# Patient Record
Sex: Male | Born: 1980 | Race: White | Hispanic: No | Marital: Single | Smoking: Never smoker
Health system: Southern US, Community
[De-identification: ages and names within clinical notes are randomized; demographics above are authoritative.]

## PROBLEM LIST (undated history)

## (undated) DIAGNOSIS — R011 Cardiac murmur, unspecified: Secondary | ICD-10-CM

## (undated) DIAGNOSIS — F419 Anxiety disorder, unspecified: Secondary | ICD-10-CM

## (undated) DIAGNOSIS — N200 Calculus of kidney: Secondary | ICD-10-CM

## (undated) HISTORY — PX: UMBILICAL HERNIA REPAIR: SHX196

## (undated) HISTORY — DX: Anxiety disorder, unspecified: F41.9

---

## 2000-08-10 ENCOUNTER — Encounter: Payer: Self-pay | Admitting: Emergency Medicine

## 2000-08-10 ENCOUNTER — Emergency Department (HOSPITAL_COMMUNITY): Admission: EM | Admit: 2000-08-10 | Discharge: 2000-08-10 | Payer: Self-pay | Admitting: Emergency Medicine

## 2003-10-22 ENCOUNTER — Ambulatory Visit (HOSPITAL_COMMUNITY): Admission: RE | Admit: 2003-10-22 | Discharge: 2003-10-22 | Payer: Self-pay | Admitting: Family Medicine

## 2014-01-03 ENCOUNTER — Emergency Department (HOSPITAL_COMMUNITY)
Admission: EM | Admit: 2014-01-03 | Discharge: 2014-01-03 | Disposition: A | Discharge status: Home / Self Care | Payer: BC Managed Care – PPO | Attending: Emergency Medicine | Admitting: Emergency Medicine

## 2014-01-03 ENCOUNTER — Encounter (HOSPITAL_COMMUNITY): Payer: Self-pay | Admitting: Emergency Medicine

## 2014-01-03 DIAGNOSIS — N200 Calculus of kidney: Secondary | ICD-10-CM | POA: Insufficient documentation

## 2014-01-03 DIAGNOSIS — M549 Dorsalgia, unspecified: Secondary | ICD-10-CM | POA: Insufficient documentation

## 2014-01-03 DIAGNOSIS — R109 Unspecified abdominal pain: Secondary | ICD-10-CM

## 2014-01-03 DIAGNOSIS — R42 Dizziness and giddiness: Secondary | ICD-10-CM | POA: Diagnosis not present

## 2014-01-03 DIAGNOSIS — R319 Hematuria, unspecified: Secondary | ICD-10-CM

## 2014-01-03 LAB — URINALYSIS, ROUTINE W REFLEX MICROSCOPIC
Bilirubin Urine: NEGATIVE
Glucose, UA: NEGATIVE mg/dL
Ketones, ur: NEGATIVE mg/dL
Leukocytes, UA: NEGATIVE
Nitrite: NEGATIVE
Protein, ur: NEGATIVE mg/dL
Specific Gravity, Urine: 1.015 (ref 1.005–1.030)
Urobilinogen, UA: 0.2 mg/dL (ref 0.0–1.0)
pH: 8 (ref 5.0–8.0)

## 2014-01-03 LAB — URINE MICROSCOPIC-ADD ON

## 2014-01-03 MED ORDER — KETOROLAC TROMETHAMINE 60 MG/2ML IM SOLN: 60.0000 mg | Freq: Once | INTRAMUSCULAR | Status: DC

## 2014-01-03 MED ORDER — HYDROCODONE-ACETAMINOPHEN 5-325 MG PO TABS: 2.0000 | ORAL_TABLET | ORAL | Status: DC | PRN

## 2014-01-03 MED ORDER — SODIUM CHLORIDE 0.9 % IV BOLUS (SEPSIS)
1000.0000 mL | Freq: Once | INTRAVENOUS | Status: AC
  Administered 2014-01-03: 1000 mL via INTRAVENOUS

## 2014-01-03 MED ORDER — KETOROLAC TROMETHAMINE 30 MG/ML IJ SOLN
30.0000 mg | Freq: Once | INTRAMUSCULAR | Status: AC
  Administered 2014-01-03: 30 mg via INTRAVENOUS
  Filled 2014-01-03: qty 1

## 2014-01-03 MED ORDER — ONDANSETRON HCL 4 MG/2ML IJ SOLN
4.0000 mg | Freq: Once | INTRAMUSCULAR | Status: AC
  Administered 2014-01-03: 4 mg via INTRAVENOUS
  Filled 2014-01-03: qty 2

## 2014-01-03 MED ORDER — MORPHINE SULFATE 4 MG/ML IJ SOLN
4.0000 mg | INTRAMUSCULAR | Status: DC | PRN
  Administered 2014-01-03: 4 mg via INTRAVENOUS
  Filled 2014-01-03: qty 1

## 2014-01-03 NOTE — ED Notes (Addendum)
abd and low back pain for 30 min, pale,  Hyperventilating.   Nausea.

## 2014-01-03 NOTE — ED Notes (Signed)
Pt denies pain and nausea, provided urinal for pt.

## 2014-01-03 NOTE — ED Provider Notes (Signed)
CSN: [id number]     Arrival date & time 01/03/14  1239 History   First MD Initiated Contact with Patient 01/03/14 1257     Chief Complaint  Patient presents with  . Back Pain     (Consider location/radiation/quality/duration/timing/severity/associated sxs/prior Treatment) HPI Comments: 33 year old male with no significant medical history, no surgery history presents with acute left flank pain prior to arrival. Patient felt mild diaphoresis mild radiation to anterior abdomen. No history of similar. No family history of kidney stones. The last few days patient's had mild difficulty with urination. No fevers.  Patient is a 33 y.o. male presenting with back pain. The history is provided by the patient.  Back Pain Associated symptoms: no abdominal pain, no chest pain, no dysuria, no fever and no headaches     History reviewed. No pertinent past medical history. History reviewed. No pertinent past surgical history. History reviewed. No pertinent family history. History  Substance Use Topics  . Smoking status: Never Smoker   . Smokeless tobacco: Not on file  . Alcohol Use: No    Review of Systems  Constitutional: Positive for appetite change. Negative for fever and chills.  HENT: Negative for congestion.   Eyes: Negative for visual disturbance.  Respiratory: Negative for shortness of breath.   Cardiovascular: Negative for chest pain.  Gastrointestinal: Positive for nausea. Negative for abdominal pain.  Genitourinary: Positive for flank pain and difficulty urinating. Negative for dysuria.  Musculoskeletal: Positive for back pain. Negative for neck pain and neck stiffness.  Skin: Negative for rash.  Neurological: Positive for light-headedness. Negative for headaches.      Allergies  Review of patient's allergies indicates no known allergies.  Home Medications   Prior to Admission medications   Not on File   BP 131/95  Pulse 63  Temp(Src) 98.5 F (36.9 C) (Oral)  Resp 18   Ht  (1.651 m)  Wt 130 lb (58.968 kg)  BMI 21.63 kg/m2  SpO2 99% Physical Exam  Nursing note and vitals reviewed. Constitutional: He is oriented to person, place, and time. He appears well-developed and well-nourished.  HENT:  Head: Normocephalic and atraumatic.  Mild dry meters membranes  Eyes: Conjunctivae are normal. Right eye exhibits no discharge. Left eye exhibits no discharge.  Neck: Normal range of motion. Neck supple. No tracheal deviation present.  Cardiovascular: Normal rate.   Pulmonary/Chest: Effort normal.  Abdominal: Soft. He exhibits no distension. There is no tenderness. There is no guarding.  Musculoskeletal: He exhibits tenderness (mild left flank). He exhibits no edema.  Neurological: He is alert and oriented to person, place, and time.  Skin: Skin is warm. No rash noted.  Psychiatric: He has a normal mood and affect.    ED Course  Procedures (including critical care time) Emergency Focused Ultrasound Exam Limited retroperitoneal ultrasound of kidneys  Performed and interpreted by Dr. Jodi Mourning Indication: flank pain Focused abdominal ultrasound with both kidneys imaged in transverse and longitudinal planes in real-time. Interpretation: moderate left hydronephrosis visualized.   Images archived electronically  Labs Review Labs Reviewed  URINALYSIS, ROUTINE W REFLEX MICROSCOPIC - Abnormal; Notable for the following:    APPearance CLOUDY (*)    Hgb urine dipstick LARGE (*)    All other components within normal limits  URINE MICROSCOPIC-ADD ON - Abnormal; Notable for the following:    Bacteria, UA FEW (*)    All other components within normal limits    Imaging Review EKG Interpretation None      MDM  Final diagnoses:  Acute left flank pain  Kidney stone on left side  Hematuria   Clinically kidney stone, urinalysis pending to look for signs of infection. Plan for IV fluids, pain meds and outpatient followup with urology. Bedside ultrasound  mild-moderate hydro-ureter/hydronephrosis Patient well-appearing otherwise in ER.  Pain control reassessment, well-appearing no concerns. Results and differential diagnosis were discussed with the patient/parent/guardian. Close follow up outpatient was discussed, comfortable with the plan.   Medications  ondansetron (ZOFRAN) injection 4 mg (not administered)  ketorolac (TORADOL) 30 MG/ML injection 30 mg (not administered)  sodium chloride 0.9 % bolus 1,000 mL (not administered)  morphine 4 MG/ML injection 4 mg (not administered)    Filed Vitals:   01/03/14 1252  BP: 131/95  Pulse: 63  Temp: 98.5 F (36.9 C)  TempSrc: Oral  Resp: 18  Height:  (1.651 m)  Weight: 130 lb (58.968 kg)  SpO2: 99%    Final diagnoses:  Acute left flank pain  Kidney stone on left side  Hematuria        Enid Skeens, MD 01/05/14 [phone]

## 2014-01-03 NOTE — Discharge Instructions (Signed)
If you were given medicines take as directed.  If you are on coumadin or contraceptives realize their levels and effectiveness is altered by many different medicines.  If you have any reaction (rash, tongues swelling, other) to the medicines stop taking and see a physician.   Strain urine.  Take ibuprofen 600 mg every 6 hrs for pain. Please follow up as directed and return to the ER or see a physician for new or worsening symptoms.  Thank you. Filed Vitals:   01/03/14 1252  BP: 131/95  Pulse: 63  Temp: 98.5 F (36.9 C)  TempSrc: Oral  Resp: 18  Height:  (1.651 m)  Weight: 130 lb (58.968 kg)  SpO2: 99%   For severe pain take norco or vicodin however realize they have the potential for addiction and it can make you sleepy and has tylenol in it.  No operating machinery while taking.

## 2014-01-03 NOTE — ED Notes (Signed)
Pt alert & oriented x4, stable gait. Patient given discharge instructions, paperwork & prescription(s). Patient  instructed to stop at the registration desk to finish any additional paperwork. Patient verbalized understanding. Pt left department w/ no further questions. 

## 2014-01-04 ENCOUNTER — Encounter (HOSPITAL_COMMUNITY): Payer: Self-pay | Admitting: Emergency Medicine

## 2014-01-04 ENCOUNTER — Emergency Department (HOSPITAL_COMMUNITY): Payer: BC Managed Care – PPO

## 2014-01-04 ENCOUNTER — Emergency Department (HOSPITAL_COMMUNITY)
Admission: EM | Admit: 2014-01-04 | Discharge: 2014-01-04 | Disposition: A | Discharge status: Home / Self Care | Payer: BC Managed Care – PPO | Attending: Emergency Medicine | Admitting: Emergency Medicine

## 2014-01-04 DIAGNOSIS — Z79899 Other long term (current) drug therapy: Secondary | ICD-10-CM | POA: Diagnosis not present

## 2014-01-04 DIAGNOSIS — R109 Unspecified abdominal pain: Secondary | ICD-10-CM | POA: Insufficient documentation

## 2014-01-04 DIAGNOSIS — N200 Calculus of kidney: Secondary | ICD-10-CM

## 2014-01-04 LAB — COMPREHENSIVE METABOLIC PANEL
ALT: 22 U/L (ref 0–53)
AST: 19 U/L (ref 0–37)
Albumin: 4.2 g/dL (ref 3.5–5.2)
Alkaline Phosphatase: 74 U/L (ref 39–117)
Anion gap: 12 (ref 5–15)
BUN: 10 mg/dL (ref 6–23)
CALCIUM: 9.3 mg/dL (ref 8.4–10.5)
CO2: 25 mEq/L (ref 19–32)
CREATININE: 1 mg/dL (ref 0.50–1.35)
Chloride: 104 mEq/L (ref 96–112)
GFR calc non Af Amer: >90 mL/min (ref >90)
GLUCOSE: 111 mg/dL — AB (ref 70–99)
Potassium: 3.8 mEq/L (ref 3.7–5.3)
Sodium: 141 mEq/L (ref 137–147)
TOTAL PROTEIN: 7.6 g/dL (ref 6.0–8.3)
Total Bilirubin: 0.5 mg/dL (ref 0.3–1.2)

## 2014-01-04 LAB — CBC WITH DIFFERENTIAL/PLATELET
BASOS ABS: 0 10*3/uL (ref 0.0–0.1)
Basophils Relative: 0 % (ref 0–1)
EOS ABS: 0.2 10*3/uL (ref 0.0–0.7)
EOS PCT: 2 % (ref 0–5)
HCT: 44.4 % (ref 39.0–52.0)
Hemoglobin: 15.2 g/dL (ref 13.0–17.0)
Lymphocytes Relative: 28 % (ref 12–46)
Lymphs Abs: 1.9 10*3/uL (ref 0.7–4.0)
MCH: 29.2 pg (ref 26.0–34.0)
MCHC: 34.2 g/dL (ref 30.0–36.0)
MCV: 85.4 fL (ref 78.0–100.0)
Monocytes Absolute: 0.6 10*3/uL (ref 0.1–1.0)
Monocytes Relative: 10 % (ref 3–12)
Neutro Abs: 4 10*3/uL (ref 1.7–7.7)
Neutrophils Relative %: 60 % (ref 43–77)
Platelets: 190 10*3/uL (ref 150–400)
RBC: 5.2 MIL/uL (ref 4.22–5.81)
RDW: 13.8 % (ref 11.5–15.5)
WBC: 6.8 10*3/uL (ref 4.0–10.5)

## 2014-01-04 MED ORDER — ONDANSETRON HCL 4 MG/2ML IJ SOLN
4.0000 mg | Freq: Once | INTRAMUSCULAR | Status: AC
  Administered 2014-01-04: 4 mg via INTRAVENOUS
  Filled 2014-01-04: qty 2

## 2014-01-04 MED ORDER — HYDROMORPHONE HCL 1 MG/ML IJ SOLN
1.0000 mg | Freq: Once | INTRAMUSCULAR | Status: AC
  Administered 2014-01-04: 1 mg via INTRAVENOUS
  Filled 2014-01-04: qty 1

## 2014-01-04 MED ORDER — KETOROLAC TROMETHAMINE 60 MG/2ML IM SOLN
60.0000 mg | Freq: Once | INTRAMUSCULAR | Status: AC
  Administered 2014-01-04: 60 mg via INTRAMUSCULAR
  Filled 2014-01-04: qty 2

## 2014-01-04 MED ORDER — TAMSULOSIN HCL 0.4 MG PO CAPS: 0.4000 mg | ORAL_CAPSULE | Freq: Every day | ORAL | Status: DC

## 2014-01-04 MED ORDER — OXYCODONE-ACETAMINOPHEN 5-325 MG PO TABS
2.0000 | ORAL_TABLET | Freq: Once | ORAL | Status: AC
  Administered 2014-01-04: 2 via ORAL
  Filled 2014-01-04: qty 2

## 2014-01-04 MED ORDER — ONDANSETRON 4 MG PO TBDP: 4.0000 mg | ORAL_TABLET | Freq: Once | ORAL | Status: DC

## 2014-01-04 NOTE — Discharge Instructions (Signed)
Follow up with [facility] next week.  If you have problems before then,   Then go to [facility] in [place] to be seen

## 2014-01-04 NOTE — ED Notes (Signed)
Pt seen earlier today with left flank pain, didn't has prescriptions filled pained has returned.

## 2014-01-04 NOTE — ED Notes (Signed)
Pt seen in [facility] Thursday for flank pain, diagnosed with kidney stone given rx for pain meds but states he didn't get them filled, now with increased pain

## 2014-01-04 NOTE — ED Provider Notes (Signed)
CSN: [id number]     Arrival date & time 01/04/14  0400 History   First MD Initiated Contact with Patient 01/04/14 [phone]     Chief Complaint  Patient presents with  . Flank Pain     (Consider location/radiation/quality/duration/timing/severity/associated sxs/prior Treatment) Patient is a 33 y.o. male presenting with flank pain. The history is provided by the patient (pt complains of severe left flank pain).  Flank Pain This is a new problem. The current episode started yesterday. The problem occurs constantly. The problem has not changed since onset.Pertinent negatives include no chest pain, no abdominal pain and no headaches. Nothing aggravates the symptoms. Nothing relieves the symptoms.    History reviewed. No pertinent past medical history. History reviewed. No pertinent past surgical history. No family history on file. History  Substance Use Topics  . Smoking status: Never Smoker   . Smokeless tobacco: Not on file  . Alcohol Use: No    Review of Systems  Constitutional: Negative for appetite change and fatigue.  HENT: Negative for congestion, ear discharge and sinus pressure.   Eyes: Negative for discharge.  Respiratory: Negative for cough.   Cardiovascular: Negative for chest pain.  Gastrointestinal: Negative for abdominal pain and diarrhea.  Genitourinary: Positive for flank pain. Negative for frequency and hematuria.  Musculoskeletal: Negative for back pain.  Skin: Negative for rash.  Neurological: Negative for seizures and headaches.  Psychiatric/Behavioral: Negative for hallucinations.      Allergies  Review of patient's allergies indicates no known allergies.  Home Medications   Prior to Admission medications   Medication Sig Start Date End Date Taking? Authorizing Provider  HYDROcodone-acetaminophen (NORCO) 5-325 MG per tablet Take 2 tablets by mouth every 4 (four) hours as needed. 01/03/14  Yes Enid Skeens, MD  tamsulosin (FLOMAX) 0.4 MG CAPS capsule  Take 1 capsule (0.4 mg total) by mouth daily. 01/04/14   Benny Lennert, MD   BP 150/94  Pulse 78  Temp(Src) 97.8 F (36.6 C) (Oral)  Resp 22  Ht  (1.651 m)  Wt 130 lb (58.968 kg)  BMI 21.63 kg/m2  SpO2 100% Physical Exam  Constitutional: He is oriented to person, place, and time. He appears well-developed.  HENT:  Head: Normocephalic.  Eyes: Conjunctivae and EOM are normal. No scleral icterus.  Neck: Neck supple. No thyromegaly present.  Cardiovascular: Normal rate and regular rhythm.  Exam reveals no gallop and no friction rub.   No murmur heard. Pulmonary/Chest: No stridor. He has no wheezes. He has no rales. He exhibits no tenderness.  Abdominal: He exhibits no distension. There is no tenderness. There is no rebound.  Genitourinary:  Tender left flank  Musculoskeletal: Normal range of motion. He exhibits no edema.  Lymphadenopathy:    He has no cervical adenopathy.  Neurological: He is oriented to person, place, and time. He exhibits normal muscle tone. Coordination normal.  Skin: No rash noted. No erythema.  Psychiatric: He has a normal mood and affect. His behavior is normal.    ED Course  Procedures (including critical care time) Labs Review Labs Reviewed  COMPREHENSIVE METABOLIC PANEL - Abnormal; Notable for the following:    Glucose, Bld 111 (*)    All other components within normal limits  CBC WITH DIFFERENTIAL    Imaging Review Ct Abdomen Pelvis Wo Contrast  01/04/2014   CLINICAL DATA:  Left flank pain  EXAM: CT ABDOMEN AND PELVIS WITHOUT CONTRAST  TECHNIQUE: Multidetector CT imaging of the abdomen and pelvis was performed following the  standard protocol without IV contrast.  COMPARISON:  None.  FINDINGS: The visualized lung bases are clear.  Limited noncontrast evaluation of the liver is unremarkable. Gallbladder within normal limits. The spleen, adrenal glands, and pancreas demonstrate a normal unenhanced appearance.  Right kidney is unremarkable without  evidence of nephrolithiasis or hydronephrosis.  On the left, there is a in obstructive 5 mm stone at the left UVJ with secondary moderate left hydroureteronephrosis. No other stones within the left kidney or along the course of the left renal collecting system.  Small hiatal hernia noted. No evidence of bowel obstruction. No acute inflammatory changes seen about the bowels. Appendix is within normal limits.  Bladder and prostate are unremarkable.  No free air or fluid.  No adenopathy.  No acute osseous abnormality. No worrisome lytic or blastic osseous lesions.  IMPRESSION: 1. 5 mm obstructive stone at the left UVJ with secondary moderate left hydroureteronephrosis. 2. No other acute intra-abdominal or pelvic process.   Electronically Signed   By: Rise Mu M.D.   On: 01/04/2014 05:57     EKG Interpretation None      MDM   Final diagnoses:  Kidney stone    Kidney stone,  Start flomax,  And pain and nausea meds and urology referral  The chart was scribed for me under my direct supervision.  I personally performed the history, physical, and medical decision making and all procedures in the evaluation of this patient.Benny Lennert, MD 01/04/14 [phone]

## 2014-01-07 ENCOUNTER — Other Ambulatory Visit: Payer: Self-pay | Admitting: Urology

## 2014-01-08 ENCOUNTER — Encounter (HOSPITAL_COMMUNITY): Payer: Self-pay | Admitting: General Practice

## 2014-01-08 ENCOUNTER — Encounter (HOSPITAL_COMMUNITY): Payer: Self-pay | Admitting: Pharmacy Technician

## 2014-01-13 MED ORDER — GENTAMICIN SULFATE 40 MG/ML IJ SOLN
300.0000 mg | INTRAVENOUS | Status: DC
  Filled 2014-01-13: qty 7.5

## 2014-01-14 ENCOUNTER — Ambulatory Visit (HOSPITAL_COMMUNITY): Admission: RE | Admit: 2014-01-14 | Payer: BC Managed Care – PPO | Source: Ambulatory Visit | Admitting: Urology

## 2014-01-14 HISTORY — DX: Calculus of kidney: N20.0

## 2014-01-14 HISTORY — DX: Cardiac murmur, unspecified: R01.1

## 2014-01-14 SURGERY — LITHOTRIPSY, ESWL
Anesthesia: LOCAL | Laterality: Left

## 2014-01-14 NOTE — H&P (Signed)
Donald Perez is an 33 y.o. male.    Chief Complaint: Pre-OP Left Shockwave Lithotripsy  HPI:   1 - Nephrolithiasis - 5mm left UVJ stone (550 HU, SSD 9cm) by CT at [facility] on eval left flank pain 12/2013. Given trial of medical passage with tamsulosin and lortab and reports no interval passage but better pain control.  Visible as most medial calcifiation at level of upper 1/4 of left femoral head on scout image and by [facility] today.   No additional stones by imaging or prior colic episdoes.   PMH unremarkable. NO strong blood thinners. NO prior surgeries. NO PCP at present.   Today Donald Perez is seen to proceed with left shockwave lithotripsy. No interval fevers.   Past Medical History  Diagnosis Date  . Kidney stone   . Heart murmur     resolved 10 years ago, workup completed, everything okay    Past Surgical History  Procedure Laterality Date  . Umbilical hernia repair      done when pt. was 1     History reviewed. No pertinent family history. Social History:  reports that he has never smoked. He does not have any smokeless tobacco history on file. He reports that he does not drink alcohol or use illicit drugs.  Allergies: No Known Allergies  No prescriptions prior to admission    No results found for this or any previous visit (from the past 48 hour(s)). No results found.  Review of Systems  Constitutional: Negative.  Negative for fever and chills.  HENT: Negative.   Eyes: Negative.   Respiratory: Negative.   Cardiovascular: Negative.   Gastrointestinal: Positive for nausea.  Genitourinary: Positive for flank pain.  Musculoskeletal: Negative.   Skin: Negative.   Neurological: Negative.   Endo/Heme/Allergies: Negative.   Psychiatric/Behavioral: Negative.     Height  (1.651 m), weight 58.968 kg (130 lb). Physical Exam  Constitutional: He appears well-developed and well-nourished.  HENT:  Head: Normocephalic.  Eyes: Pupils are equal, round, and reactive to  light.  Cardiovascular: Normal rate.   Respiratory: Effort normal.  GI: Soft. Bowel sounds are normal.  Genitourinary:  Mild Left CVAT  Musculoskeletal: Normal range of motion.  Neurological: He is alert.  Skin: Skin is warm and dry.  Psychiatric: He has a normal mood and affect. His behavior is normal. Judgment and thought content normal.     Assessment/Plan    1 - Nephrolithiasis -    We rediscussed shockwave lithotripsy in detail as well as my "rule of 9s" with stones <34mm, less than 900 HU, and skin to stone distance <9cm having approximately 90% treatment success with single session of treatment. We then readdressed how stones that are larger, more dense, and in patients with less favorable anatomy have incrementally decreased success rates. We rediscussed risks including, bleeding, infection, hematoma, loss of kidney, need for staged therapy, need for adjunctive therapy and requirement to refrain from any anticoagulants, anti-platelet or aspirin-like products peri-procedureally. After careful consideration, the patient has chosen to proceed today as planned.  Melayah Skorupski 01/14/2014, 6:35 AM

## 2015-06-24 IMAGING — CT CT ABD-PELV W/O CM
3 of 4 series · 8 of 46 positions shown, 15 images · non-contrast
Comparison: None.

CLINICAL DATA: Left flank pain

EXAM:
CT ABDOMEN AND PELVIS WITHOUT CONTRAST
TECHNIQUE: Multidetector CT imaging of the abdomen and pelvis was performed
following the standard protocol without IV contrast.

[Series 3: mpr coronal (id) · coronal · 0.60mm/px · 3 of 82 slices shown, 4 images]
[im 28/82  soft-tissue]
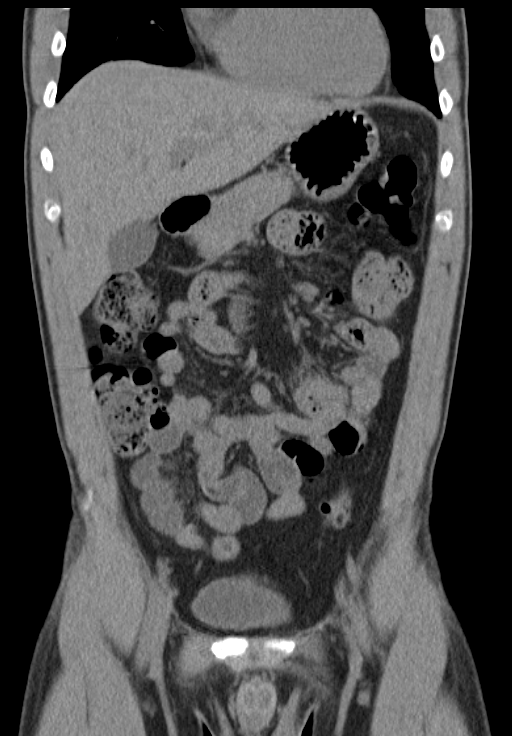
[im 37/82  soft-tissue]
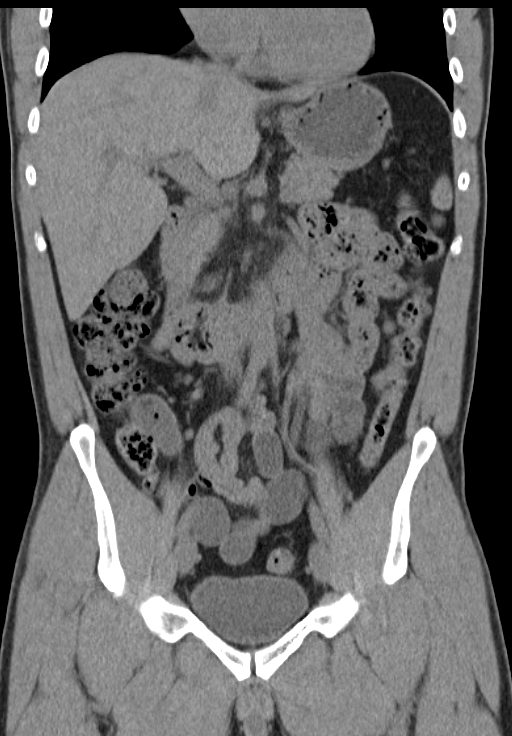
[im 37/82  bone]
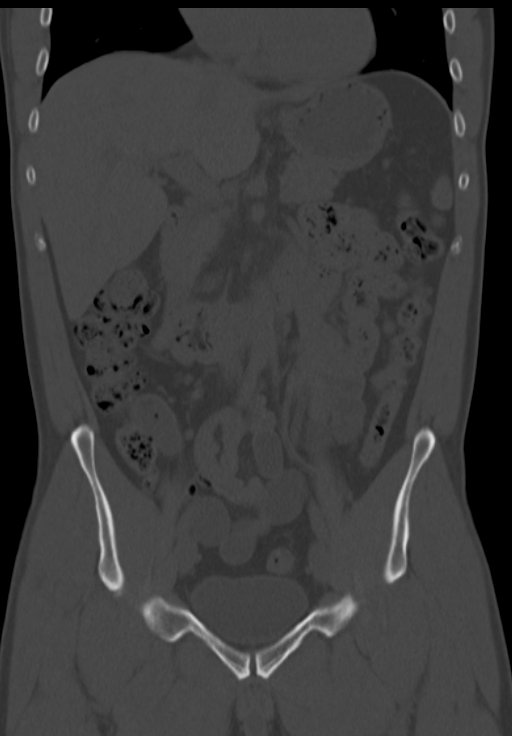
[im 46/82  soft-tissue]
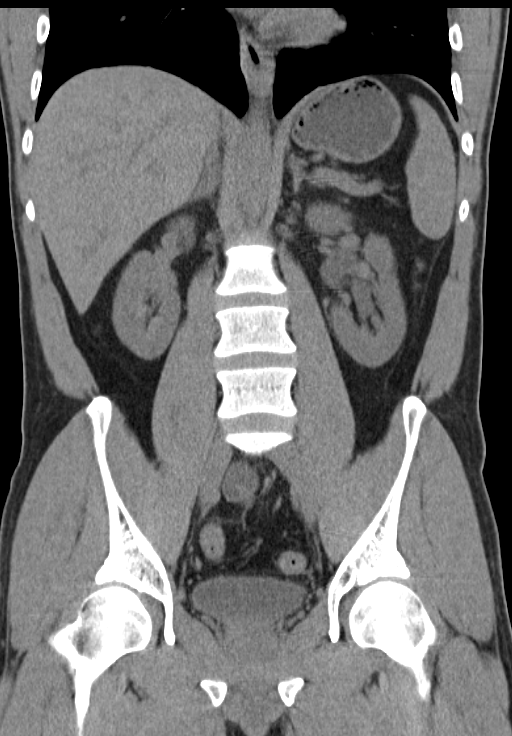

[Series 4: mpr sagittal (id) · sagittal · 0.49mm/px · 1 of 103 slices shown, 2 images]
[im 35/103  soft-tissue]
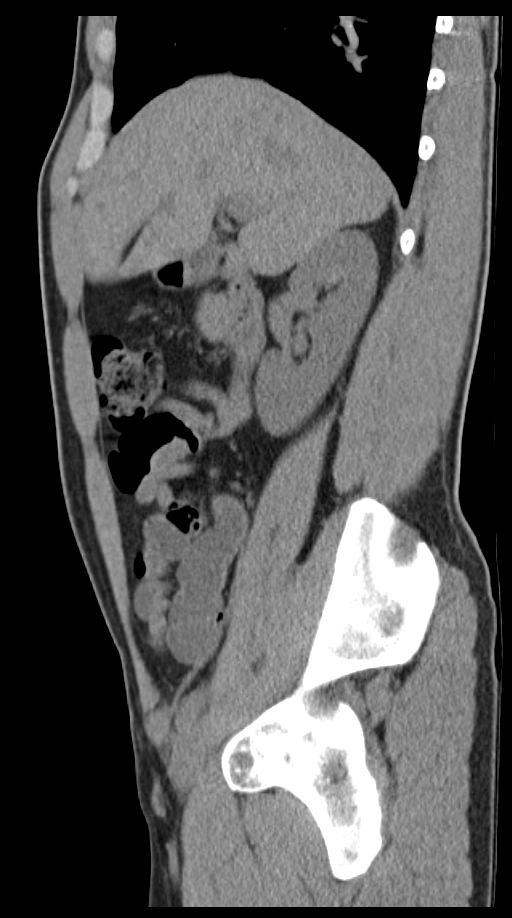
[im 35/103  bone]
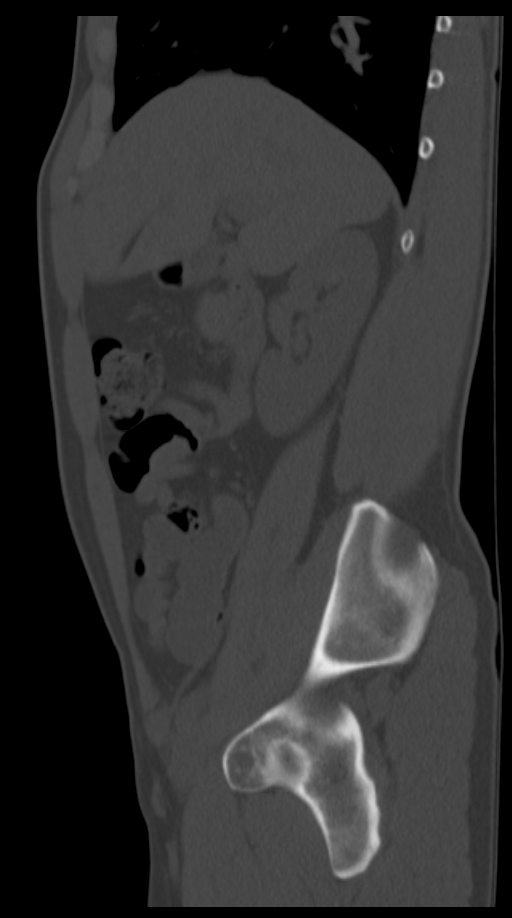

[Series 6: lung 5.0 b60f · axial · 0.66mm/px · z∈[-124,-64]mm · 4 of 21 slices shown, 9 images]
[im 5/21  soft-tissue]
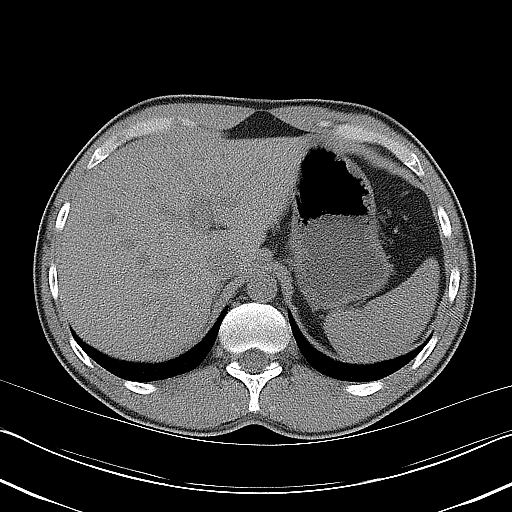
[im 5/21  lung]
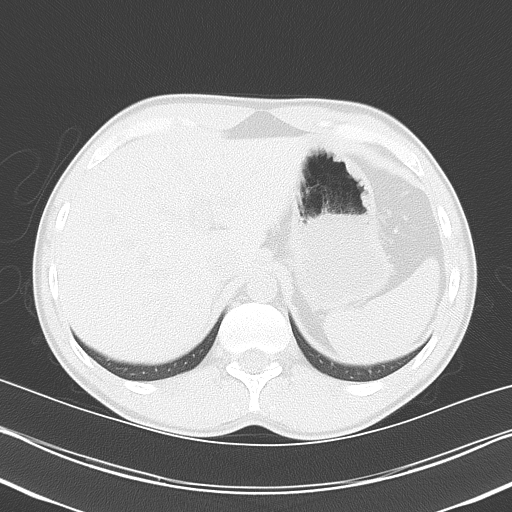
[im 5/21  bone]
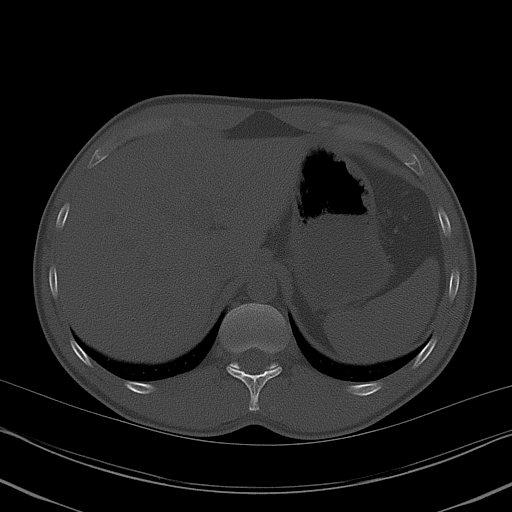
[im 9/21  soft-tissue]
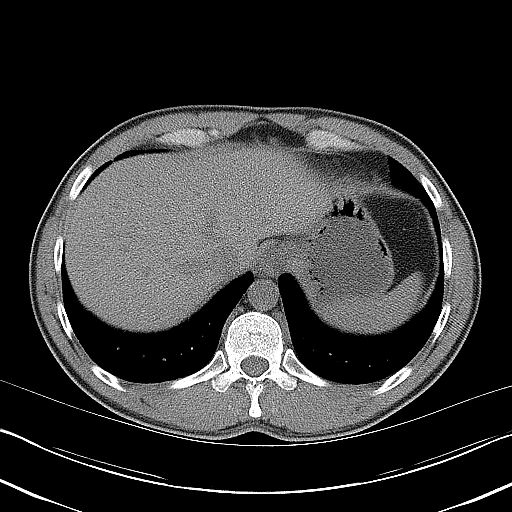
[im 9/21  lung]
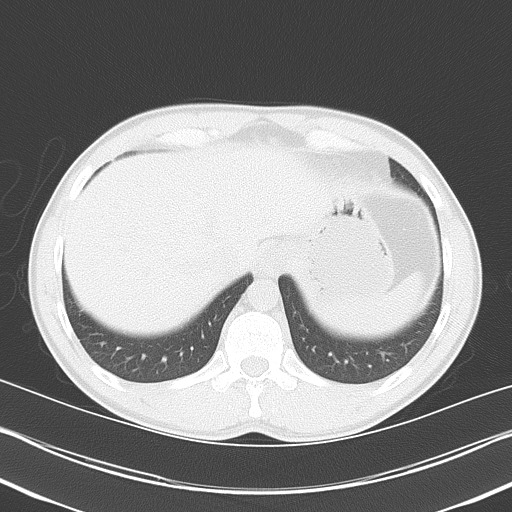
[im 13/21  soft-tissue]
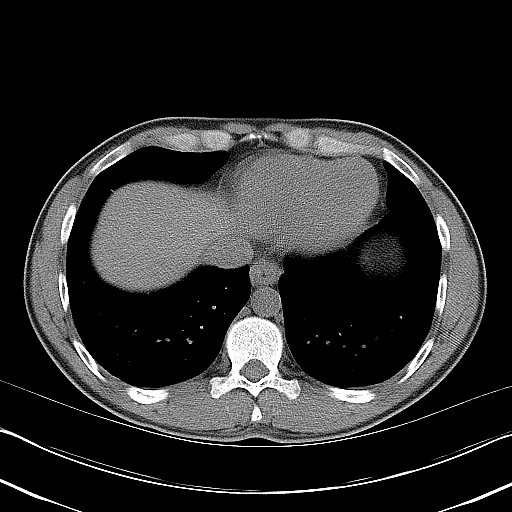
[im 13/21  lung]
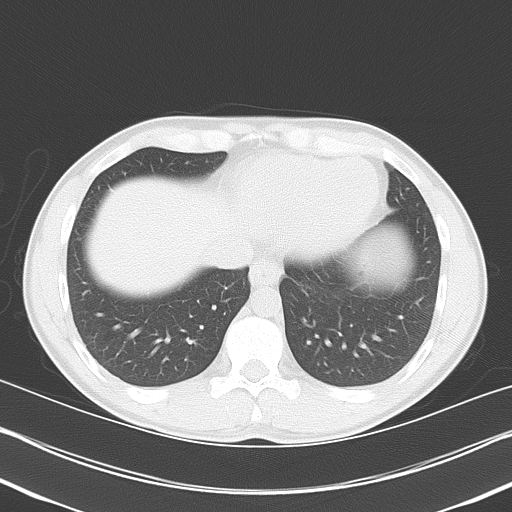
[im 17/21  soft-tissue]
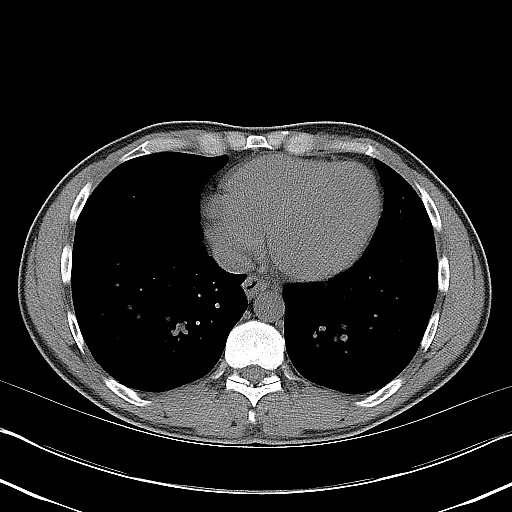
[im 17/21  lung]
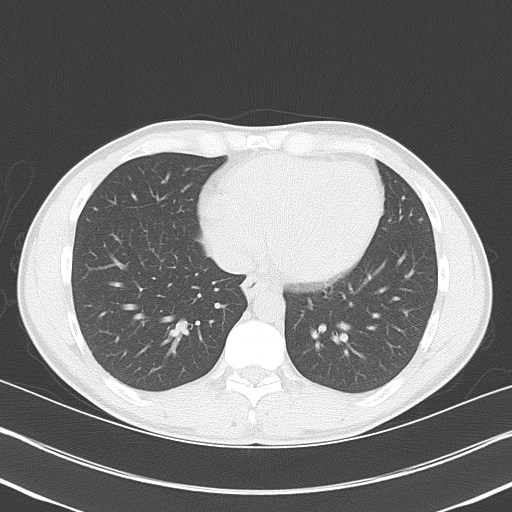

[8 of 46 positions shown; findings below may reference images not displayed]

FINDINGS: The visualized lung bases are clear.

Limited noncontrast evaluation of the liver is unremarkable.
Gallbladder within normal limits. The spleen, adrenal glands, and
pancreas demonstrate a normal unenhanced appearance.

Right kidney is unremarkable without evidence of nephrolithiasis or
hydronephrosis.

On the left, there is a in obstructive 5 mm stone at the left UVJ
with secondary moderate left hydroureteronephrosis. No other stones
within the left kidney or along the course of the left renal
collecting system.

Small hiatal hernia noted. No evidence of bowel obstruction. No
acute inflammatory changes seen about the bowels. Appendix is within
normal limits.

Bladder and prostate are unremarkable.

No free air or fluid.  No adenopathy.

No acute osseous abnormality. No worrisome lytic or blastic osseous
lesions.
IMPRESSION: 1. 5 mm obstructive stone at the left UVJ with secondary moderate
left hydroureteronephrosis.
2. No other acute intra-abdominal or pelvic process.

## 2015-09-16 ENCOUNTER — Emergency Department (HOSPITAL_COMMUNITY)
Admission: EM | Admit: 2015-09-16 | Discharge: 2015-09-16 | Disposition: A | Discharge status: Home / Self Care | Payer: Self-pay | Attending: Dermatology | Admitting: Dermatology

## 2015-09-16 ENCOUNTER — Encounter (HOSPITAL_COMMUNITY): Payer: Self-pay | Admitting: Emergency Medicine

## 2015-09-16 DIAGNOSIS — Z5321 Procedure and treatment not carried out due to patient leaving prior to being seen by health care provider: Secondary | ICD-10-CM | POA: Insufficient documentation

## 2015-09-16 DIAGNOSIS — R110 Nausea: Secondary | ICD-10-CM | POA: Insufficient documentation

## 2015-09-16 DIAGNOSIS — R52 Pain, unspecified: Secondary | ICD-10-CM | POA: Insufficient documentation

## 2015-09-16 DIAGNOSIS — R509 Fever, unspecified: Secondary | ICD-10-CM | POA: Insufficient documentation

## 2015-09-16 NOTE — ED Notes (Signed)
Notified by registration that patient was leaving. 

## 2015-09-16 NOTE — ED Notes (Addendum)
Patient complaining of fever, vomiting, body aches, and cold chills since yesterday. Patient states "my friend gave me a zofran and amoxicillin approximately 1-2 hours ago but the zofran isn't helping."

## 2018-12-16 ENCOUNTER — Other Ambulatory Visit: Payer: Self-pay

## 2018-12-16 ENCOUNTER — Emergency Department (HOSPITAL_COMMUNITY): Payer: BC Managed Care – PPO

## 2018-12-16 ENCOUNTER — Emergency Department (HOSPITAL_COMMUNITY)
Admission: EM | Admit: 2018-12-16 | Discharge: 2018-12-16 | Disposition: A | Discharge status: Home / Self Care | Payer: BC Managed Care – PPO | Attending: Emergency Medicine | Admitting: Emergency Medicine

## 2018-12-16 ENCOUNTER — Encounter (HOSPITAL_COMMUNITY): Payer: Self-pay

## 2018-12-16 DIAGNOSIS — R079 Chest pain, unspecified: Secondary | ICD-10-CM | POA: Insufficient documentation

## 2018-12-16 DIAGNOSIS — M25511 Pain in right shoulder: Secondary | ICD-10-CM

## 2018-12-16 DIAGNOSIS — Z79899 Other long term (current) drug therapy: Secondary | ICD-10-CM | POA: Diagnosis not present

## 2018-12-16 LAB — CBC WITH DIFFERENTIAL/PLATELET
Abs Immature Granulocytes: 0.02 10*3/uL (ref 0.00–0.07)
Basophils Absolute: 0 10*3/uL (ref 0.0–0.1)
Basophils Relative: 0 %
Eosinophils Absolute: 0.6 10*3/uL — ABNORMAL HIGH (ref 0.0–0.5)
Eosinophils Relative: 6 %
HCT: 44.8 % (ref 39.0–52.0)
Hemoglobin: 14.8 g/dL (ref 13.0–17.0)
Immature Granulocytes: 0 %
Lymphocytes Relative: 19 %
Lymphs Abs: 1.8 10*3/uL (ref 0.7–4.0)
MCH: 29.2 pg (ref 26.0–34.0)
MCHC: 33 g/dL (ref 30.0–36.0)
MCV: 88.4 fL (ref 80.0–100.0)
Monocytes Absolute: 0.8 10*3/uL (ref 0.1–1.0)
Monocytes Relative: 9 %
Neutro Abs: 6.5 10*3/uL (ref 1.7–7.7)
Neutrophils Relative %: 66 %
Platelets: 219 10*3/uL (ref 150–400)
RBC: 5.07 MIL/uL (ref 4.22–5.81)
RDW: 12.9 % (ref 11.5–15.5)
WBC: 9.8 10*3/uL (ref 4.0–10.5)
nRBC: 0 % (ref 0.0–0.2)

## 2018-12-16 LAB — BASIC METABOLIC PANEL
Anion gap: 8 (ref 5–15)
BUN: 11 mg/dL (ref 6–20)
CO2: 24 mmol/L (ref 22–32)
Calcium: 8.8 mg/dL — ABNORMAL LOW (ref 8.9–10.3)
Chloride: 105 mmol/L (ref 98–111)
Creatinine, Ser: 0.88 mg/dL (ref 0.61–1.24)
GFR calc Af Amer: >60 mL/min (ref >60)
GFR calc non Af Amer: >60 mL/min (ref >60)
Glucose, Bld: 102 mg/dL — ABNORMAL HIGH (ref 70–99)
Potassium: 4 mmol/L (ref 3.5–5.1)
Sodium: 137 mmol/L (ref 135–145)

## 2018-12-16 MED ORDER — NAPROXEN 500 MG PO TABS: 500.0000 mg | ORAL_TABLET | Freq: Two times a day (BID) | ORAL | 0 refills | Status: DC | PRN

## 2018-12-16 MED ORDER — KETOROLAC TROMETHAMINE 60 MG/2ML IM SOLN
30.0000 mg | Freq: Once | INTRAMUSCULAR | Status: AC
  Administered 2018-12-16: 10:00:00 30 mg via INTRAMUSCULAR
  Filled 2018-12-16: qty 2

## 2018-12-16 NOTE — Discharge Instructions (Addendum)
Be sure to read and understand instructions below prior to leaving the hospital. If your symptoms persist without any improvement in 1 week it is reccommended that you follow up with orthopedics.  Common mechanisms of injury include:   Direct hit (trauma) to the shoulder.  Aging, erosion of the tendon with normal use.  Bony bump on shoulder (acromial spur).  Stress from sudden increase in duration, frequency, or intensity of training  RISK INCREASES WITH:  Contact sports (football, wrestling, boxing).  Throwing sports (baseball, tennis, volleyball).  Weightlifting and bodybuilding.  Heavy labor.  Previous injury to the rotator cuff, including impingement.  Poor shoulder strength and flexibility.  Failure to warm up properly before activity.  Inadequate protective equipment.  Old age.  Bony bump on shoulder (acromial spur).   RELATED COMPLICATIONS  Shoulder stiffness, frozen shoulder, or loss of motion.  Rotator cuff tendon tear.  Recurring symptoms, especially if activity is resumed too soon, with overuse, with a direct blow, or when using poor technique.   TREATMENT  Treatment first involves the use of ice and medicine, to reduce pain and inflammation. The use of strengthening and stretching exercises may help reduce pain with activity. These exercises may be performed at home or with a therapist.  If non-surgical treatment is unsuccessful after more than 6 months, surgery may be advised. MEDICATION  nonsteroidal anti-inflammatory medicines (Motrin and ibuprofen), or other minor pain relievers (acetaminophen)  ACTIVITY       - It is reccommended to perform range of motion activity as tolerated to prevent shoulder stiffness.  HEAT AND COLD  Cold treatment (icing) should be applied for 10 to 15 minutes every 2 to 3 hours for inflammation and pain, and immediately after activity that aggravates your symptoms. Use ice packs or an ice massage.  Heat treatment may be used before  performing stretching and strengthening activities prescribed by your caregiver, physical therapist, or athletic trainer. Use a heat pack or a warm water soak.  SEEK IMMEDIATE MEDICAL CARE IF:  Your arm, hand, or fingers are numb or tingling.  Your arm, hand, or fingers are swollen, painful, or turn white or blue.  You develop chest pain or shortness of breath.

## 2018-12-16 NOTE — ED Provider Notes (Signed)
[facility] EMERGENCY DEPARTMENT Provider Note   CSN: [id number] Arrival date & time: 12/16/18  0807     History   Chief Complaint Chief Complaint  Patient presents with  . Shoulder Pain  . Chest Pain    HPI Donald Perez is a 38 y.o. right hand dominant male with past medical history of heart murmur, kidney stones presents emergency department today with chief complaint of right shoulder pain.  This is been going on x one and a half months.  He states it has gotten worse over the last 2 nights.  He describes the pain as sharp. Occasionally the pain radiates down into right side of his chest.  He states the pain is worse with movement.  At rest he rates the pain 2/10 but with movement it worsens to 8/10. He has been taking ibuprofen at home and reports transient pain relief.  He denies any known injury to the right shoulder.  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 He admits to feeling like he needs to take a deep breath sometimes, but denies actual dyspnea. He denies diaphoresis, fever, chills, neck pain,back pain, chest pain on exertion, abdominal pain, numbness, weakness. History provided by patient with additional history obtained from chart review.     Past Medical History:  Diagnosis Date  . Heart murmur    resolved 10 years ago, workup completed, everything okay  . Kidney stone     There are no active problems to display for this patient.   Past Surgical History:  Procedure Laterality Date  . UMBILICAL HERNIA REPAIR     done when pt. was 1         Home Medications    Prior to Admission medications   Medication Sig Start Date End Date Taking? Authorizing Provider  HYDROcodone-acetaminophen (NORCO/VICODIN) 5-325 MG per tablet Take 2 tablets by mouth every 4 (four) hours as needed for moderate pain.    [provider]  naproxen (NAPROSYN) 500 MG tablet Take 1 tablet (500 mg total) by mouth 2 (two) times  daily as needed for moderate pain. 12/16/18   Albrizze, Kaitlyn E, PA-C  PRESCRIPTION MEDICATION Take 1 tablet by mouth as needed. For nausea and vomitting.    [provider]  tamsulosin (FLOMAX) 0.4 MG CAPS capsule Take 0.4 mg by mouth daily.    [provider]    Family History No family history on file.  Social History Social History   Tobacco Use  . Smoking status: Never Smoker  . Smokeless tobacco: Never Used  Substance Use Topics  . Alcohol use: No  . Drug use: No     Allergies   Patient has no known allergies.   Review of Systems Review of Systems  Constitutional: Negative for chills and fever.  HENT: Negative for congestion, rhinorrhea, sinus pressure and sore throat.   Eyes: Negative for pain and redness.  Respiratory: Negative for cough, shortness of breath and wheezing.   Cardiovascular: Negative for chest pain and palpitations.  Gastrointestinal: Negative for abdominal pain, constipation, diarrhea, nausea and vomiting.  Genitourinary: Negative for dysuria.  Musculoskeletal: Positive for arthralgias. Negative for back pain, myalgias and neck pain.  Skin: Negative for rash and wound.  Neurological: Negative for dizziness, syncope, weakness, numbness and headaches.  Psychiatric/Behavioral: Negative for confusion.     Physical Exam Updated Vital Signs BP 111/82   Pulse 73   Temp 98.6 F (  37 C) (Oral)   Resp 13   Ht 5\' 5"  (1.651 m)   Wt 63.5 kg   SpO2 97%   BMI 23.30 kg/m   Physical Exam Vitals signs and nursing note reviewed.  Constitutional:      General: He is not in acute distress.    Appearance: He is not ill-appearing.  HENT:     Head: Normocephalic and atraumatic.     Right Ear: Tympanic membrane and external ear normal.     Left Ear: Tympanic membrane and external ear normal.     Nose: Nose normal.     Mouth/Throat:     Mouth: Mucous membranes are moist.     Pharynx: Oropharynx is clear.  Eyes:     General: No scleral  icterus.       Right eye: No discharge.        Left eye: No discharge.     Extraocular Movements: Extraocular movements intact.     Conjunctiva/sclera: Conjunctivae normal.     Pupils: Pupils are equal, round, and reactive to light.  Neck:     Musculoskeletal: Normal range of motion.     Vascular: No JVD.  Cardiovascular:     Rate and Rhythm: Normal rate and regular rhythm.     Pulses: Normal pulses.          Radial pulses are 2+ on the right side and 2+ on the left side.     Heart sounds: Normal heart sounds. No murmur.  Pulmonary:     Comments: Lungs clear to auscultation in all fields. Symmetric chest rise. No wheezing, rales, or rhonchi. Chest:     Chest wall: Tenderness (right chest wall) present.  Abdominal:     Comments: Abdomen is soft, non-distended, and non-tender in all quadrants. No rigidity, no guarding. No peritoneal signs.  Musculoskeletal: Normal range of motion.     Right lower leg: No edema.     Left lower leg: No edema.     Comments: Right shoulder with tenderness to palpation as depicted in image. Full ROM. Negative empty can test, negative Neer's, positive Lift off. No swelling, erythema or ecchymosis present. No step-off, crepitus, or deformity appreciated. 5/5 muscle strength of bilateral UE. 2+ radial pulse, sensation intact and all compartments soft.   Skin:    General: Skin is warm and dry.     Capillary Refill: Capillary refill takes less than 2 seconds.  Neurological:     Mental Status: He is oriented to person, place, and time.     GCS: GCS eye subscore is 4. GCS verbal subscore is 5. GCS motor subscore is 6.     Comments: Fluent speech, no facial droop.  Psychiatric:        Behavior: Behavior normal.      ED Treatments / Results  Labs (all labs ordered are listed, but only abnormal results are displayed) Labs Reviewed  CBC WITH DIFFERENTIAL/PLATELET - Abnormal; Notable for the following components:      Result Value   Eosinophils Absolute 0.6  (*)    All other components within normal limits  BASIC METABOLIC PANEL - Abnormal; Notable for the following components:   Glucose, Bld 102 (*)    Calcium 8.8 (*)    All other components within normal limits    EKG EKG Interpretation  Date/Time:  Saturday December 16 2018 08:18:58 EDT Ventricular Rate:  84 PR Interval:    QRS Duration: 99 QT Interval:  370 QTC Calculation: 438 R Axis:  14 Text Interpretation:  Sinus rhythm Probable left atrial enlargement Artifact No old tracing to compare Confirmed by Samuel JesterMcManus, Kathleen [id number]) on 12/16/2018 9:18:00 AM   Radiology Dg Shoulder Right  Result Date: 12/16/2018 CLINICAL DATA:  Pain without trauma. EXAM: RIGHT SHOULDER - 2+ VIEW COMPARISON:  None. FINDINGS: No acute fracture or dislocation. Visualized portion of the right hemithorax is normal. IMPRESSION: No acute osseous abnormality. Electronically Signed   By: Jeronimo GreavesKyle  Talbot M.D.   On: 12/16/2018 10:19    Procedures Procedures (including critical care time)  Medications Ordered in ED Medications  ketorolac (TORADOL) injection 30 mg (30 mg Intramuscular Given 12/16/18 0956)     Initial Impression / Assessment and Plan / ED Course  I have reviewed the triage vital signs and the nursing notes.  Pertinent labs & imaging results that were available during my care of the patient were reviewed by me and considered in my medical decision making (see chart for details).  Patient presents to the ED with complaints of pain to the right shoulder without known injury. Exam without obvious deformity or open wounds. ROM intact. Tender to palpation. NVI distally. Xray negative for fracture/dislocation. Suspect some degree of rotator cuff tear. Low suspicion for ACS, pt with EKG sinus rhythm, pain has been going on x over 1 month, and chest pain is reproducible on exam. Labs are unremarkable. PRICE and naproxen recommended. I discussed results, treatment plan, need for follow-up with pcp or ortho,  and return precautions with the patient. Provided opportunity for questions, patient confirmed understanding and are in agreement with plan. Findings and plan of care discussed with supervising physician Dr. Clarene DukeMcManus  This note was prepared using Dragon voice recognition software and may include unintentional dictation errors due to the inherent limitations of voice recognition software.   Final Clinical Impressions(s) / ED Diagnoses   Final diagnoses:  Right shoulder pain, unspecified chronicity    ED Discharge Orders         Ordered    naproxen (NAPROSYN) 500 MG tablet  2 times daily PRN     12/16/18 1046           Albrizze, MiddlebergKaitlyn E, PA-C 12/16/18 1102    Samuel JesterMcManus, Kathleen, DO 12/21/18 1541

## 2018-12-16 NOTE — ED Triage Notes (Addendum)
Pt reports bilateral shoulder pain for the past 1 1/2  Months.  Reports r shoulder worse than left.  Pt says also having some tightness in r side of chest. Pt says pain is worse with movement.  Denies any injury.  Pt says has had some difficulty breathing for the past 2 nights.  PT says feels like he has to take a deep breath.  Denies cough or fever.

## 2018-12-16 NOTE — ED Notes (Signed)
PA at bedside.

## 2019-03-20 ENCOUNTER — Other Ambulatory Visit: Payer: Self-pay

## 2019-03-20 DIAGNOSIS — Z20822 Contact with and (suspected) exposure to covid-19: Secondary | ICD-10-CM

## 2019-03-22 ENCOUNTER — Telehealth: Payer: Self-pay | Admitting: *Deleted

## 2019-03-22 LAB — NOVEL CORONAVIRUS, NAA: SARS-CoV-2, NAA: NOT DETECTED

## 2019-03-22 NOTE — Telephone Encounter (Signed)
Pt calling for covid results, active. Pt made aware not resulted yet.

## 2019-03-23 NOTE — Telephone Encounter (Signed)
Pt given result of COVID test; he verbalized understanding. 

## 2020-04-05 ENCOUNTER — Encounter: Payer: Self-pay | Admitting: Emergency Medicine

## 2020-04-05 ENCOUNTER — Other Ambulatory Visit: Payer: Self-pay

## 2020-04-05 ENCOUNTER — Ambulatory Visit
Admission: EM | Admit: 2020-04-05 | Discharge: 2020-04-05 | Disposition: A | Discharge status: Home / Self Care | Payer: BC Managed Care – PPO | Attending: Emergency Medicine | Admitting: Emergency Medicine

## 2020-04-05 DIAGNOSIS — R39198 Other difficulties with micturition: Secondary | ICD-10-CM

## 2020-04-05 LAB — POCT URINALYSIS DIP (MANUAL ENTRY)
Bilirubin, UA: NEGATIVE
Blood, UA: NEGATIVE
Glucose, UA: NEGATIVE mg/dL
Ketones, POC UA: NEGATIVE mg/dL
Leukocytes, UA: NEGATIVE
Nitrite, UA: NEGATIVE
Protein Ur, POC: NEGATIVE mg/dL
Spec Grav, UA: >=1.03 — AB (ref 1.010–1.025)
Urobilinogen, UA: 0.2 E.U./dL
pH, UA: 7 (ref 5.0–8.0)

## 2020-04-05 MED ORDER — TAMSULOSIN HCL 0.4 MG PO CAPS: 0.4000 mg | ORAL_CAPSULE | Freq: Every day | ORAL | 0 refills | Status: DC

## 2020-04-05 NOTE — ED Provider Notes (Addendum)
[facility] CARE CENTER   Chief Complaint  Patient presents with   Urinary Retention     SUBJECTIVE:  Donald Perez is a 39 y.o. male who c presented to the urgent care for complaint of urinary discomfort,  pressure and difficulty urinating for the past 2 days.  Patient denies a precipitating event, recent sexual encounter, excessive caffeine intake.  Has tried OTC medications without relief.  Symptoms are made worse with urination.  Admits to similar symptoms in the past.  Denies fever, chills, nausea, vomiting, abdominal pain, flank pain, penile discharge or bleeding, hematuria.    LMP: No LMP for male patient.  ROS: As in HPI.  All other pertinent ROS negative.     Past Medical History:  Diagnosis Date   Heart murmur    resolved 10 years ago, workup completed, everything okay   Kidney stone    Past Surgical History:  Procedure Laterality Date   UMBILICAL HERNIA REPAIR     done when pt. was 1    No Known Allergies No current facility-administered medications on file prior to encounter.   Current Outpatient Medications on File Prior to Encounter  Medication Sig Dispense Refill   HYDROcodone-acetaminophen (NORCO/VICODIN) 5-325 MG per tablet Take 2 tablets by mouth every 4 (four) hours as needed for moderate pain.     naproxen (NAPROSYN) 500 MG tablet Take 1 tablet (500 mg total) by mouth 2 (two) times daily as needed for moderate pain. 30 tablet 0   PRESCRIPTION MEDICATION Take 1 tablet by mouth as needed. For nausea and vomitting.     Social History   Socioeconomic History   Marital status: Single    Spouse name: Not on file   Number of children: Not on file   Years of education: Not on file   Highest education level: Not on file  Occupational History   Not on file  Tobacco Use   Smoking status: Never Smoker   Smokeless tobacco: Never Used  Substance and Sexual Activity   Alcohol use: No   Drug use: No   Sexual activity: Not on file  Other  Topics Concern   Not on file  Social History Narrative   Not on file   Social Determinants of Health   Financial Resource Strain: Not on file  Food Insecurity: Not on file  Transportation Needs: Not on file  Physical Activity: Not on file  Stress: Not on file  Social Connections: Not on file  Intimate Partner Violence: Not on file   No family history on file.  OBJECTIVE:  Vitals:   04/05/20 1335  BP: 131/78  Pulse: 81  Resp: 18  Temp: 97.9 F (36.6 C)  TempSrc: Oral  SpO2: 97%   General appearance: AOx3 in no acute distress HEENT: NCAT.  Oropharynx clear.  Lungs: clear to auscultation bilaterally without adventitious breath sounds Heart: regular rate and rhythm.  Radial pulses 2+ symmetrical bilaterally Abdomen: soft; non-distended; no tenderness; bowel sounds present; no guarding or rebound tenderness Back: no CVA tenderness Extremities: no edema; symmetrical with no gross deformities Skin: warm and dry Neurologic: Ambulates from chair to exam table without difficulty Psychological: alert and cooperative; normal mood and affect  Labs Reviewed  POCT URINALYSIS DIP (MANUAL ENTRY) - Abnormal; Notable for the following components:      Result Value   Clarity, UA cloudy (*)    Spec Grav, UA >=1.030 (*)    All other components within normal limits  URINE CULTURE    ASSESSMENT &  PLAN:  1. Difficulty urinating   2. Abnormality of urination     Meds ordered this encounter  Medications   tamsulosin (FLOMAX) 0.4 MG CAPS capsule    Sig: Take 1 capsule (0.4 mg total) by mouth daily after breakfast.    Dispense:  30 capsule    Refill:  0    Patient is stable at discharge.  His symptom is more likely related to his prostate.  He was advised to follow PCP for further reevaluation.  Discharge instructions  Urine culture sent.  We will call you with the results.   Push fluids and get plenty of rest.  n Follow up with PCP if symptoms persists Return here or go  to ER if you have any new or worsening symptoms such as fever, worsening abdominal pain, nausea/vomiting, flank pain, etc...  Outlined signs and symptoms indicating need for more acute intervention. Patient verbalized understanding. After Visit Summary given.     Durward Parcel, FNP 04/05/20 1354    Durward Parcel, FNP 04/05/20 1355    Durward Parcel, FNP 04/05/20 1401

## 2020-04-05 NOTE — Discharge Instructions (Signed)
Urine culture sent.  We will call you with the results.   Push fluids and get plenty of rest.  n Follow up with PCP if symptoms persists Return here or go to ER if you have any new or worsening symptoms such as fever, worsening abdominal pain, nausea/vomiting, flank pain, etc..

## 2020-04-05 NOTE — ED Triage Notes (Signed)
Discomfort and pressure in urethra and urinary frequency that started x 2 days ago.

## 2020-04-06 LAB — URINE CULTURE
Culture: NO GROWTH
Special Requests: NORMAL

## 2020-06-04 IMAGING — DX RIGHT SHOULDER - 2+ VIEW
3 series · 3 of 3 positions shown · non-contrast
Comparison: None.

CLINICAL DATA: Pain without trauma.

EXAM:
RIGHT SHOULDER - 2+ VIEW

[shoulder grashey]
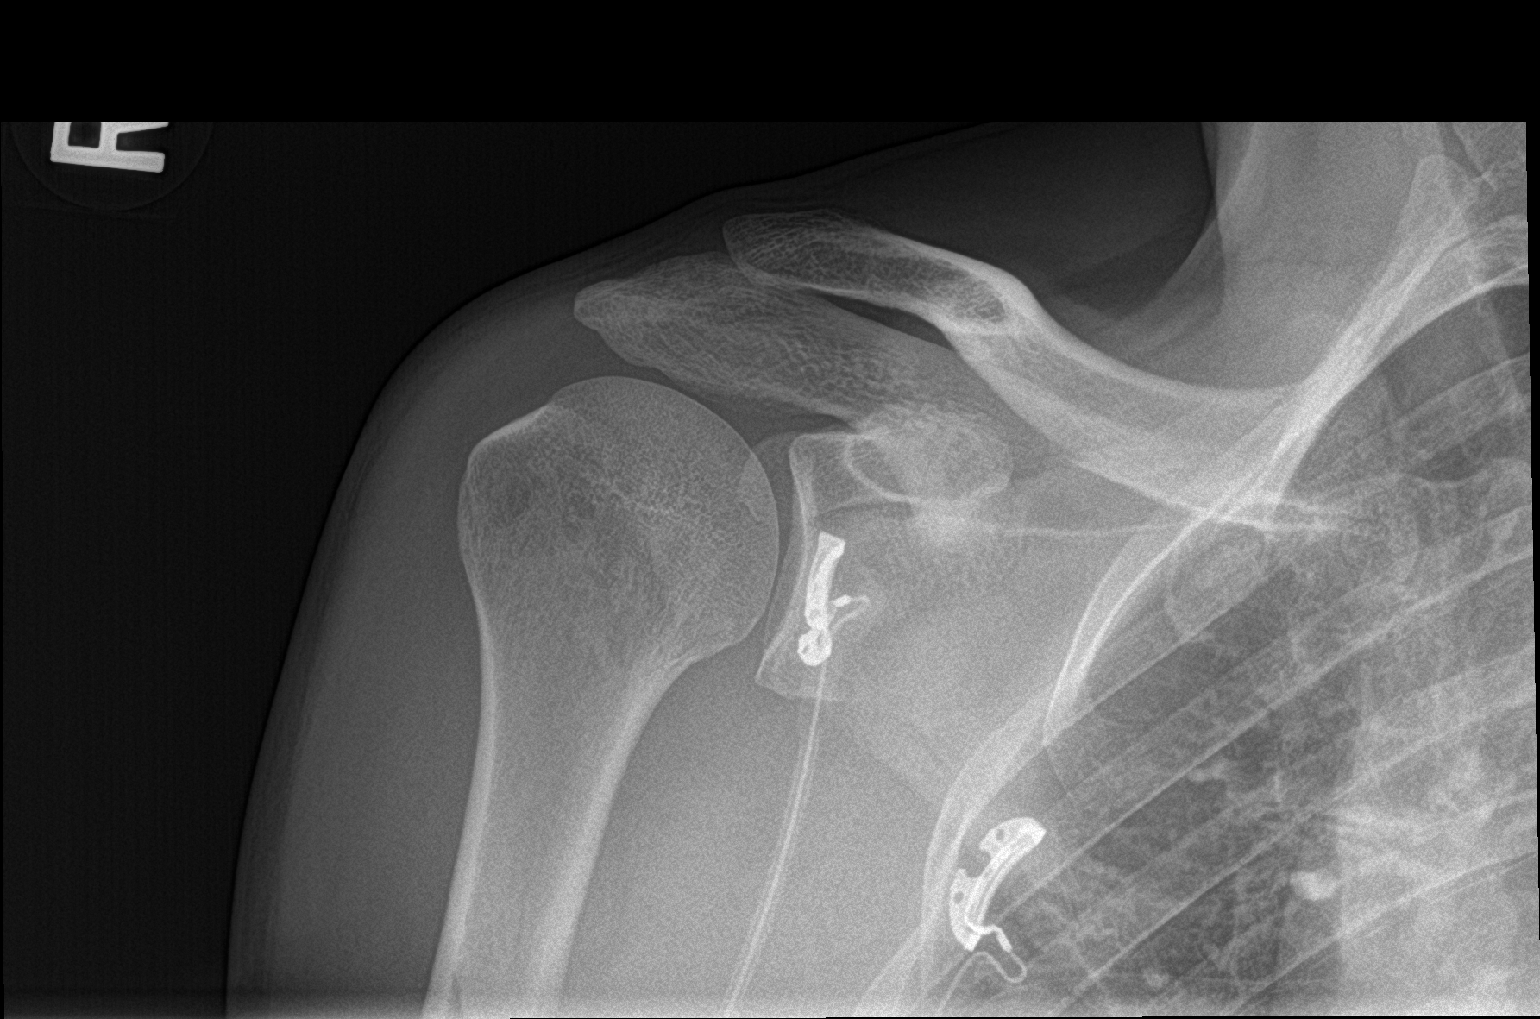

[shoulder y view]
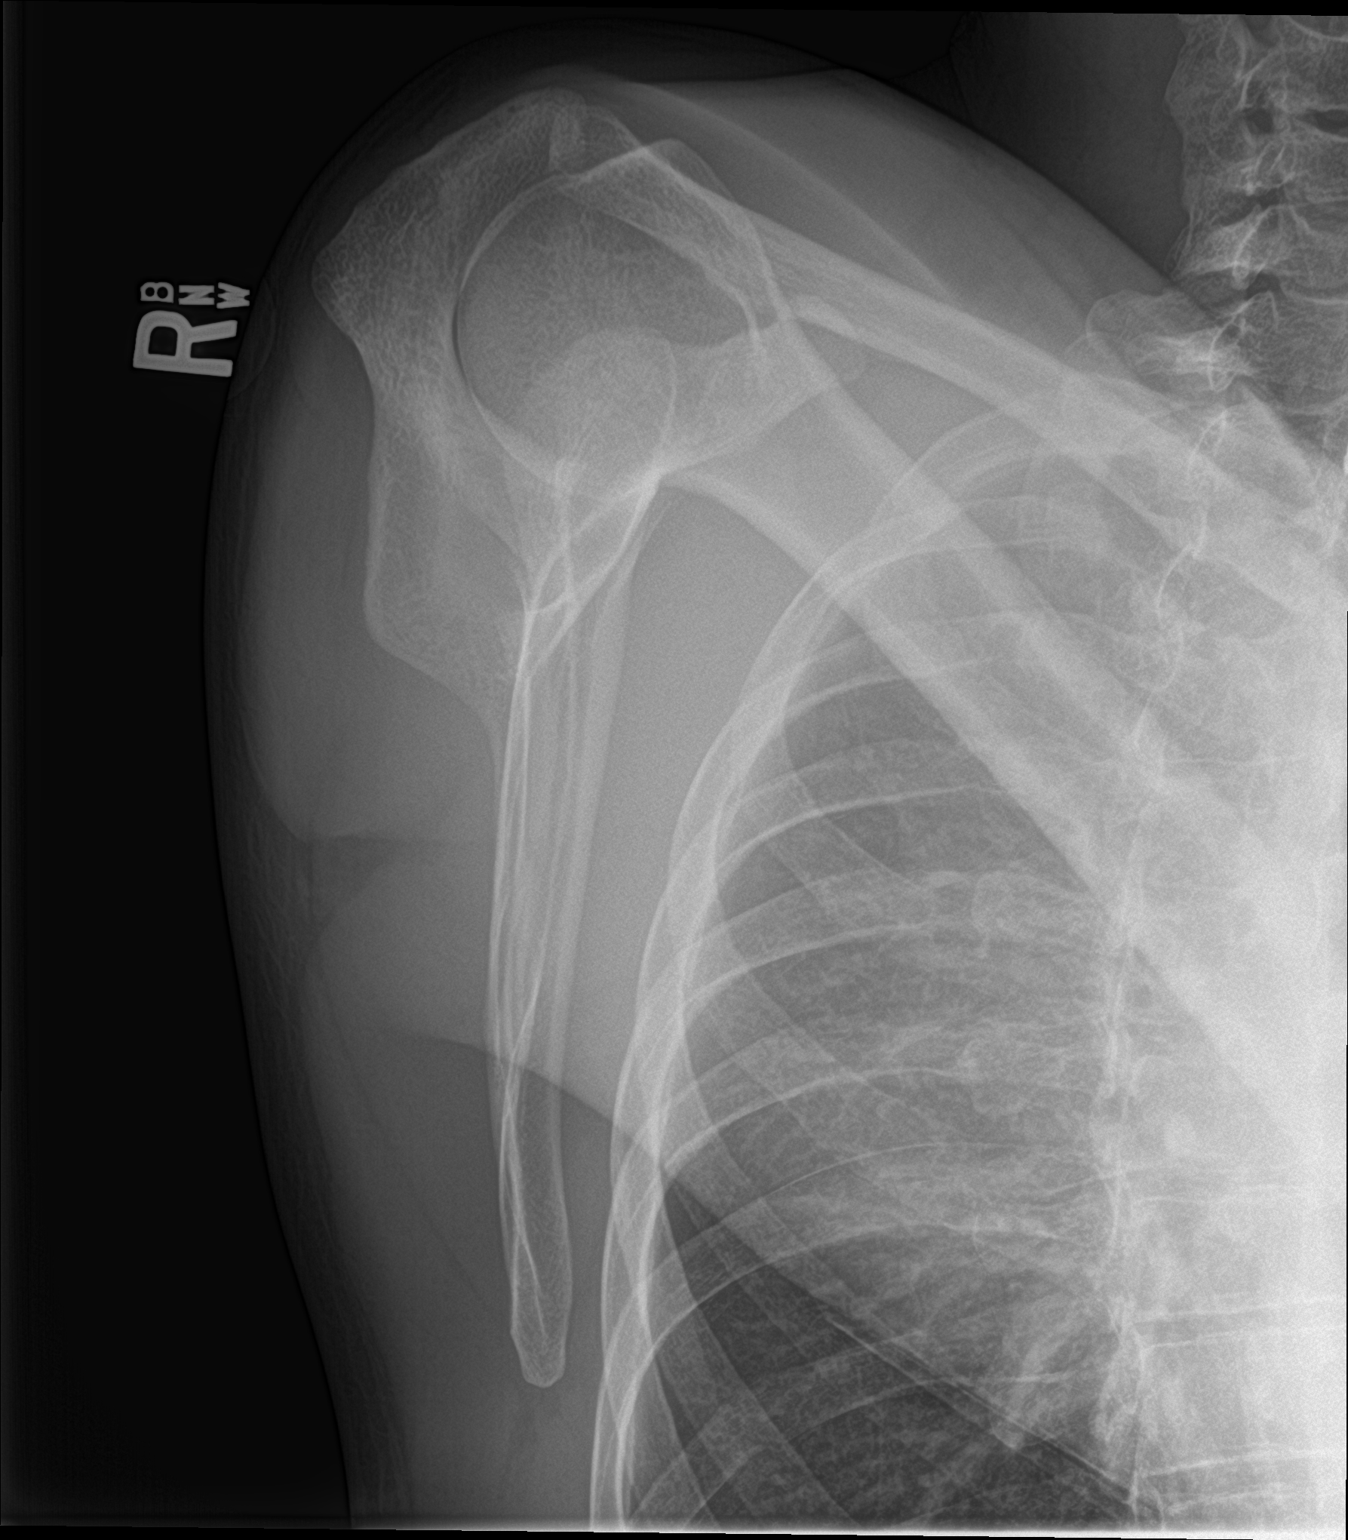

[shoulder axillary]
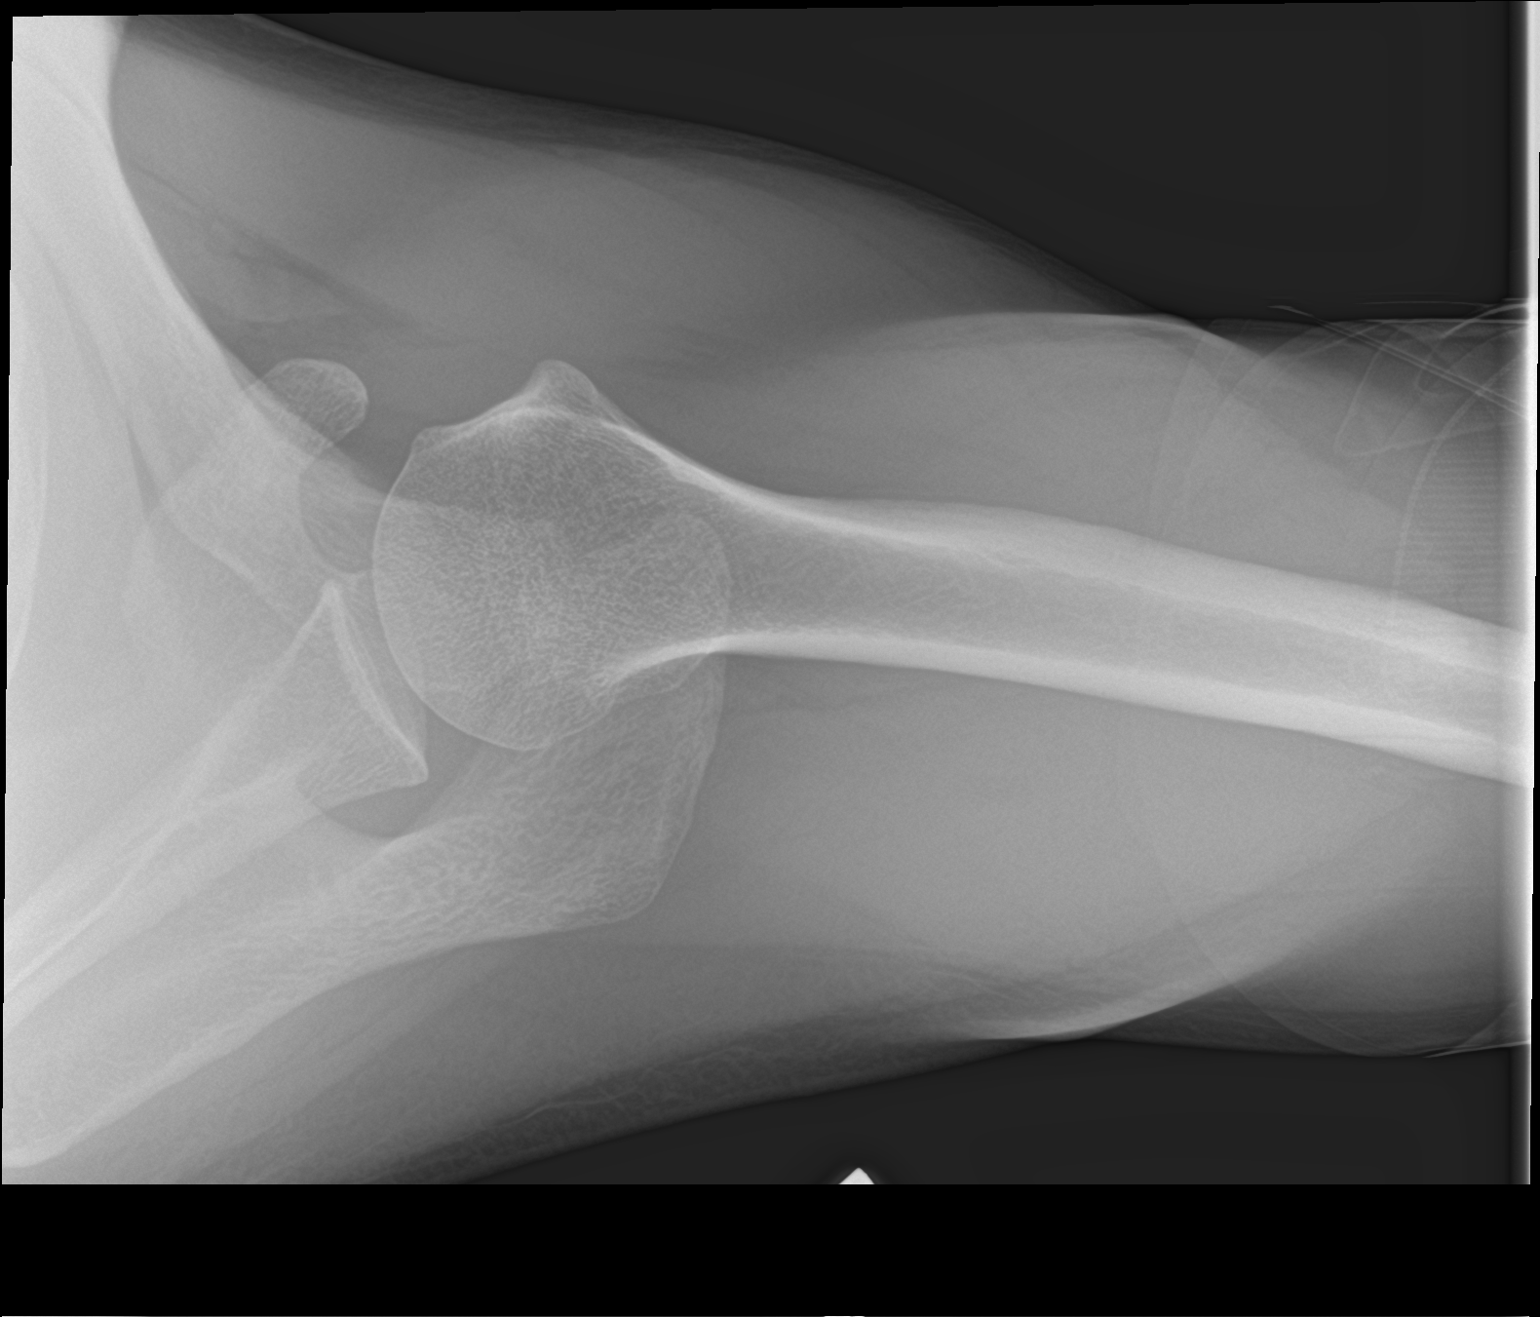

[3 of 3 positions shown; findings below may reference images not displayed]

FINDINGS: No acute fracture or dislocation. Visualized portion of the right
hemithorax is normal.
IMPRESSION: No acute osseous abnormality.

## 2021-09-18 DIAGNOSIS — J069 Acute upper respiratory infection, unspecified: Secondary | ICD-10-CM | POA: Diagnosis not present

## 2021-09-18 DIAGNOSIS — Z20822 Contact with and (suspected) exposure to covid-19: Secondary | ICD-10-CM | POA: Diagnosis not present

## 2021-09-18 DIAGNOSIS — M791 Myalgia, unspecified site: Secondary | ICD-10-CM | POA: Diagnosis not present

## 2021-09-22 ENCOUNTER — Encounter (HOSPITAL_COMMUNITY): Payer: Self-pay | Admitting: *Deleted

## 2021-09-22 ENCOUNTER — Emergency Department (HOSPITAL_COMMUNITY)
Admission: EM | Admit: 2021-09-22 | Discharge: 2021-09-22 | Disposition: A | Discharge status: Home / Self Care | Payer: BC Managed Care – PPO | Attending: Emergency Medicine | Admitting: Emergency Medicine

## 2021-09-22 DIAGNOSIS — K0889 Other specified disorders of teeth and supporting structures: Secondary | ICD-10-CM | POA: Insufficient documentation

## 2021-09-22 MED ORDER — AMOXICILLIN 500 MG PO CAPS: 500.0000 mg | ORAL_CAPSULE | Freq: Two times a day (BID) | ORAL | 0 refills | Status: DC

## 2021-09-22 NOTE — ED Provider Notes (Signed)
  [facility] Provider Note   CSN: [id number] Arrival date & time: 09/22/21  0018     History  Chief Complaint  Patient presents with   Dental Pain    Donald Perez is a 41 y.o. male.  The history is provided by the patient.  Dental Pain Location:  Upper Quality:  Aching Severity:  Moderate Onset quality:  Sudden Timing:  Constant Chronicity:  New Relieved by:  Nothing Associated symptoms: no fever   Pain in his right upper molars.    Home Medications Prior to Admission medications   Medication Sig Start Date End Date Taking? Authorizing Provider  amoxicillin (AMOXIL) 500 MG capsule Take 1 capsule (500 mg total) by mouth 2 (two) times daily. 09/22/21  Yes Ripley Fraise, MD  HYDROcodone-acetaminophen (NORCO/VICODIN) 5-325 MG per tablet Take 2 tablets by mouth every 4 (four) hours as needed for moderate pain.    [provider]  naproxen (NAPROSYN) 500 MG tablet Take 1 tablet (500 mg total) by mouth 2 (two) times daily as needed for moderate pain. 12/16/18   Walisiewicz, Verline Lema E, PA-C  PRESCRIPTION MEDICATION Take 1 tablet by mouth as needed. For nausea and vomitting.    [provider]  tamsulosin (FLOMAX) 0.4 MG CAPS capsule Take 1 capsule (0.4 mg total) by mouth daily after breakfast. 04/05/20   Avegno, Darrelyn Hillock, FNP      Allergies    Patient has no known allergies.    Review of Systems   Review of Systems  Constitutional:  Negative for fever.   Physical Exam Updated Vital Signs BP (!) 151/97 (BP Location: Right Arm)   Pulse 80   Temp 98.1 F (36.7 C) (Oral)   Resp 16   SpO2 94%  Physical Exam CONSTITUTIONAL: Well developed/well nourished HEAD AND FACE: Normocephalic/atraumatic EYES: EOMI ENMT: Mucous membranes moist.  Overall good dentition.  Tenderness noted to right posterior molar.  No fluctuance, no drainage.  No significant tooth decay is noted No facial swelling NECK: supple no meningeal signs NEURO: Pt is  awake/alert, moves all extremitiesx4 EXTREMITIES:full ROM SKIN: warm, color normal  ED Results / Procedures / Treatments   Labs (all labs ordered are listed, but only abnormal results are displayed) Labs Reviewed - No data to display  EKG None  Radiology No results found.  Procedures Procedures    Medications Ordered in ED Medications - No data to display  ED Course/ Medical Decision Making/ A&P                           Medical Decision Making Risk Prescription drug management.   Prescribed antibiotics, but advised him to call his dentist in the morning prior to starting        Final Clinical Impression(s) / ED Diagnoses Final diagnoses:  Pain, dental    Rx / DC Orders ED Discharge Orders          Ordered    amoxicillin (AMOXIL) 500 MG capsule  2 times daily        09/22/21 Sharen Heck, MD 09/22/21 0202

## 2021-09-22 NOTE — ED Triage Notes (Signed)
Pt states he "felt a pop in my tooth" this morning. Upper right tooth pain today, denies fevers. Last took tylenol extra strength about 20 minutes ago.

## 2023-01-07 DIAGNOSIS — U071 COVID-19: Secondary | ICD-10-CM | POA: Diagnosis not present

## 2023-04-26 ENCOUNTER — Ambulatory Visit: Payer: BC Managed Care – PPO | Admitting: Family Medicine

## 2023-04-27 ENCOUNTER — Ambulatory Visit: Payer: BC Managed Care – PPO | Admitting: Family Medicine

## 2023-05-16 ENCOUNTER — Encounter: Payer: Self-pay | Admitting: Family Medicine

## 2023-05-16 ENCOUNTER — Ambulatory Visit (INDEPENDENT_AMBULATORY_CARE_PROVIDER_SITE_OTHER): Payer: BC Managed Care – PPO | Admitting: Family Medicine

## 2023-05-16 VITALS — BP 130/81 | HR 68 | Temp 98.2°F | Resp 18 | Ht 65.0 in | Wt 156.9 lb

## 2023-05-16 DIAGNOSIS — F429 Obsessive-compulsive disorder, unspecified: Secondary | ICD-10-CM

## 2023-05-16 DIAGNOSIS — Z7689 Persons encountering health services in other specified circumstances: Secondary | ICD-10-CM

## 2023-05-16 DIAGNOSIS — F411 Generalized anxiety disorder: Secondary | ICD-10-CM | POA: Diagnosis not present

## 2023-05-16 MED ORDER — FLUOXETINE HCL 10 MG PO TABS: 10.0000 mg | ORAL_TABLET | Freq: Every day | ORAL | 1 refills | Status: DC

## 2023-05-16 NOTE — Progress Notes (Signed)
New Patient Office Visit  Subjective    Patient ID: Donald Perez, male    DOB: 04/15/1981  Age: 43 y.o. MRN: [id number]  CC:  Chief Complaint  Patient presents with   Establish Care    Patient is here to establish care with a new PCP, he would like to review his medications    HPI Donald Perez presents to establish care. Pt is new to me.  Anxiety Pt has hx of anxiety. He has ran out of his medicine for the last year. He was to take 1 tab a day. He reports he wouldn't take it everyday due to making him feel weird. He reports while he was on it, it was also interfering with intercourse. He reports he was only taking this.  He reports he over thinks and read into things too much. He knows it's small things to get upset over. He also reports he loses sleep over small issues and blows things out of proportion. He also reports irritable at time. He has 30 y.o son, 82 y.o son, 27, 68 and 59 y.o daughters.  He also reports he can get overwhelmed at times.  He also reports OCD traits.    05/16/2023   11:19 AM  GAD 7 : Generalized Anxiety Score  Nervous, Anxious, on Edge 3  Control/stop worrying 1  Worry too much - different things 1  Trouble relaxing 1  Restless 1  Easily annoyed or irritable 2  Afraid - awful might happen 1  Total GAD 7 Score 10  Anxiety Difficulty Somewhat difficult      No outpatient encounter medications on file as of 05/16/2023.   No facility-administered encounter medications on file as of 05/16/2023.    Past Medical History:  Diagnosis Date   Anxiety    Heart murmur    resolved 10 years ago, workup completed, everything okay   Kidney stone     Past Surgical History:  Procedure Laterality Date   UMBILICAL HERNIA REPAIR     done when pt. was 1     Family History  Problem Relation Age of Onset   Healthy Mother    Healthy Father     Social History   Socioeconomic History   Marital status: Single    Spouse name: Not on file   Number of  children: Not on file   Years of education: Not on file   Highest education level: Not on file  Occupational History   Not on file  Tobacco Use   Smoking status: Never    Passive exposure: Never   Smokeless tobacco: Never  Vaping Use   Vaping status: Never Used  Substance and Sexual Activity   Alcohol use: No   Drug use: No   Sexual activity: Yes    Partners: Female  Other Topics Concern   Not on file  Social History Narrative   Not on file   Social Drivers of Health   Financial Resource Strain: Not on file  Food Insecurity: Not on file  Transportation Needs: Not on file  Physical Activity: Not on file  Stress: Not on file  Social Connections: Not on file  Intimate Partner Violence: Not on file    Review of Systems  All other systems reviewed and are negative.       Objective    BP 130/81   Pulse 68   Temp 98.2 F (36.8 C) (Oral)   Resp 18   Ht 5\' 5"  (1.651 m)  Wt 156 lb 14.4 oz (71.2 kg)   SpO2 96%   BMI 26.11 kg/m   Physical Exam Vitals and nursing note reviewed.  Constitutional:      Appearance: Normal appearance. He is normal weight.  HENT:     Head: Normocephalic and atraumatic.     Right Ear: External ear normal.     Left Ear: External ear normal.     Nose: Nose normal.     Mouth/Throat:     Mouth: Mucous membranes are moist.     Pharynx: Oropharynx is clear.  Eyes:     Conjunctiva/sclera: Conjunctivae normal.     Pupils: Pupils are equal, round, and reactive to light.  Cardiovascular:     Rate and Rhythm: Normal rate and regular rhythm.     Pulses: Normal pulses.     Heart sounds: Normal heart sounds.  Pulmonary:     Effort: Pulmonary effort is normal.     Breath sounds: Normal breath sounds.  Abdominal:     General: Abdomen is flat. Bowel sounds are normal.  Skin:    General: Skin is warm.     Capillary Refill: Capillary refill takes less than 2 seconds.  Neurological:     General: No focal deficit present.     Mental Status:  He is alert and oriented to person, place, and time. Mental status is at baseline.  Psychiatric:        Mood and Affect: Mood normal.        Behavior: Behavior normal.        Thought Content: Thought content normal.        Judgment: Judgment normal.       Assessment & Plan:   Problem List Items Addressed This Visit   None  Encounter to establish care with new doctor  GAD (generalized anxiety disorder) -     FLUoxetine HCl; Take 1 tablet (10 mg total) by mouth daily.  Dispense: 30 tablet; Refill: 1  Obsessive-compulsive disorder, unspecified type -     FLUoxetine HCl; Take 1 tablet (10 mg total) by mouth daily.  Dispense: 30 tablet; Refill: 1  Sent Prozac/Fluoxetine 10mg  daily to try for GAD and OCD traits. To see back in 4 weeks for follow up.  No follow-ups on file.   Suzan Slick, MD

## 2023-07-18 ENCOUNTER — Other Ambulatory Visit: Payer: Self-pay

## 2023-07-18 DIAGNOSIS — F429 Obsessive-compulsive disorder, unspecified: Secondary | ICD-10-CM

## 2023-07-18 DIAGNOSIS — F411 Generalized anxiety disorder: Secondary | ICD-10-CM

## 2023-07-18 MED ORDER — FLUOXETINE HCL 10 MG PO TABS
10.0000 mg | ORAL_TABLET | Freq: Every day | ORAL | 1 refills | Status: DC
Start: 2023-07-18 — End: 2023-07-26

## 2023-07-26 ENCOUNTER — Other Ambulatory Visit: Payer: Self-pay

## 2023-07-26 ENCOUNTER — Telehealth: Payer: Self-pay

## 2023-07-26 DIAGNOSIS — F411 Generalized anxiety disorder: Secondary | ICD-10-CM

## 2023-07-26 DIAGNOSIS — F429 Obsessive-compulsive disorder, unspecified: Secondary | ICD-10-CM

## 2023-07-26 MED ORDER — FLUOXETINE HCL 10 MG PO TABS
10.0000 mg | ORAL_TABLET | Freq: Every day | ORAL | 1 refills | Status: AC
Start: 2023-07-26 — End: ?

## 2023-07-26 NOTE — Telephone Encounter (Signed)
 Medication sent to [facility] per patients request
